# Patient Record
Sex: Female | Born: 1955 | Race: White | Hispanic: No | Marital: Single
Health system: Southern US, Community
[De-identification: ages and names within clinical notes are randomized; demographics above are authoritative.]

## PROBLEM LIST (undated history)

## (undated) DIAGNOSIS — I10 Essential (primary) hypertension: Secondary | ICD-10-CM

## (undated) HISTORY — PX: SHOULDER SURGERY: SHX246

## (undated) HISTORY — DX: Essential (primary) hypertension: I10

---

## 2020-03-22 ENCOUNTER — Emergency Department (HOSPITAL_BASED_OUTPATIENT_CLINIC_OR_DEPARTMENT_OTHER)
Admission: EM | Admit: 2020-03-22 | Discharge: 2020-03-22 | Disposition: A | Discharge status: Home / Self Care | Payer: Worker's Compensation | Attending: Emergency Medicine | Admitting: Emergency Medicine

## 2020-03-22 ENCOUNTER — Encounter (HOSPITAL_BASED_OUTPATIENT_CLINIC_OR_DEPARTMENT_OTHER): Payer: Self-pay | Admitting: Emergency Medicine

## 2020-03-22 ENCOUNTER — Other Ambulatory Visit: Payer: Self-pay

## 2020-03-22 ENCOUNTER — Emergency Department (HOSPITAL_BASED_OUTPATIENT_CLINIC_OR_DEPARTMENT_OTHER): Payer: Worker's Compensation

## 2020-03-22 DIAGNOSIS — Y9389 Activity, other specified: Secondary | ICD-10-CM | POA: Insufficient documentation

## 2020-03-22 DIAGNOSIS — Y929 Unspecified place or not applicable: Secondary | ICD-10-CM | POA: Diagnosis not present

## 2020-03-22 DIAGNOSIS — I10 Essential (primary) hypertension: Secondary | ICD-10-CM | POA: Diagnosis not present

## 2020-03-22 DIAGNOSIS — W19XXXA Unspecified fall, initial encounter: Secondary | ICD-10-CM | POA: Diagnosis not present

## 2020-03-22 DIAGNOSIS — M545 Low back pain, unspecified: Secondary | ICD-10-CM

## 2020-03-22 DIAGNOSIS — Y999 Unspecified external cause status: Secondary | ICD-10-CM | POA: Diagnosis not present

## 2020-03-22 MED ORDER — NAPROXEN 375 MG PO TABS: 375.0000 mg | ORAL_TABLET | Freq: Two times a day (BID) | ORAL | 0 refills | Status: AC

## 2020-03-22 MED ORDER — HYDROCODONE-ACETAMINOPHEN 5-325 MG PO TABS
1.0000 | ORAL_TABLET | Freq: Once | ORAL | Status: AC
  Administered 2020-03-22: 1 via ORAL
  Filled 2020-03-22: qty 1

## 2020-03-22 MED ORDER — METHOCARBAMOL 750 MG PO TABS: 750.0000 mg | ORAL_TABLET | Freq: Every evening | ORAL | 0 refills | Status: AC | PRN

## 2020-03-22 NOTE — ED Triage Notes (Signed)
Pt presents to ED with c/o lower back pain after falling at work today . Daughter from [facility] called [phone] Marylene Land

## 2020-03-22 NOTE — Discharge Instructions (Signed)

## 2020-03-22 NOTE — ED Provider Notes (Signed)
[facility] EMERGENCY DEPARTMENT Provider Note   CSN: [id number] Arrival date & time: 03/22/20  1202     History Chief Complaint  Patient presents with  . Back Pain  . Fall    Samantha Boyd is a 64 y.o. female.  HPI   64 year old female with a history of hypertension presenting for evaluation after a fall.  Patient states that she was pulling something at work and it was very heavy.  It suddenly moved forward and she fell backwards and hit her back on a metal object.  She denies any other injuries including no head trauma or LOC.  Denies any numbness/weakness to the legs.  Denies any loss control of bowel or bladder function.  Has been ambulatory since this occurred.  Past Medical History:  Diagnosis Date  . Hypertension     There are no problems to display for this patient.   Past Surgical History:  Procedure Laterality Date  . SHOULDER SURGERY       OB History   No obstetric history on file.     No family history on file.  Social History   Tobacco Use  . Smoking status: Not on file  Substance Use Topics  . Alcohol use: Not on file  . Drug use: Not on file    Home Medications Prior to Admission medications   Medication Sig Start Date End Date Taking? Authorizing Provider  methocarbamol (ROBAXIN) 750 MG tablet Take 1 tablet (750 mg total) by mouth at bedtime as needed for up to 5 days for muscle spasms. 03/22/20 03/27/20  Aviyanna Colbaugh S, PA-C  naproxen (NAPROSYN) 375 MG tablet Take 1 tablet (375 mg total) by mouth 2 (two) times daily for 5 days. 03/22/20 03/27/20  Ambriel Gorelick S, PA-C    Allergies    Patient has no known allergies.  Review of Systems   Review of Systems  Constitutional: Negative for fever.  HENT: Negative for ear pain.   Eyes: Negative for visual disturbance.  Respiratory: Negative for shortness of breath.   Cardiovascular: Negative for chest pain.  Gastrointestinal: Negative for abdominal pain.  Genitourinary: Negative  for flank pain.  Musculoskeletal: Positive for back pain.  Skin: Negative for wound.  Neurological: Negative for headaches.       No head trauma or LOC  All other systems reviewed and are negative.   Physical Exam Updated Vital Signs BP (!) 155/88 (BP Location: Left Arm)   Pulse 79   Temp (!) 97 F (36.1 C) (Oral)   Resp 16   Ht 5\' 1"  (1.549 m)   Wt 65.8 kg   SpO2 99%   BMI 27.40 kg/m   Physical Exam Vitals and nursing note reviewed.  Constitutional:      General: She is not in acute distress.    Appearance: She is well-developed.  HENT:     Head: Normocephalic and atraumatic.  Eyes:     Conjunctiva/sclera: Conjunctivae normal.  Cardiovascular:     Rate and Rhythm: Normal rate and regular rhythm.  Pulmonary:     Effort: Pulmonary effort is normal.     Breath sounds: Normal breath sounds.  Musculoskeletal:        General: Normal range of motion.     Cervical back: Neck supple.     Comments: TTP to the lower thoracic and upper lumbar spine with associated right sided paraspinous TTP. 5/5 strength to the BLE with normal sensation bilat.   Skin:    General: Skin is  warm and dry.  Neurological:     Mental Status: She is alert.     ED Results / Procedures / Treatments   Labs (all labs ordered are listed, but only abnormal results are displayed) Labs Reviewed - No data to display  EKG None  Radiology DG Thoracic Spine 2 View  Result Date: 03/22/2020 CLINICAL DATA:  Pain following fall EXAM: THORACIC SPINE 3 VIEWS COMPARISON:  None. FINDINGS: Frontal, lateral, and spot lumbosacral lateral images were obtained. There is no fracture or spondylolisthesis. There is mild disc space narrowing at multiple levels. There are multiple anterior and right-sided osteophytes. No erosive change or paraspinous lesions. 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 No fracture or spondylolisthesis. Electronically Signed   By: Bretta Bang III M.D.   On:  03/22/2020 13:33   DG Lumbar Spine Complete  Result Date: 03/22/2020 CLINICAL DATA:  Pain following fall EXAM: LUMBAR SPINE - COMPLETE 4+ VIEW COMPARISON:  None. FINDINGS: Frontal, lateral, spot lumbosacral lateral, and bilateral oblique views were obtained. There are 5 non-rib-bearing lumbar type vertebral bodies. There is slight lumbar levoscoliosis. There is no fracture or spondylolisthesis. There is moderately severe disc space narrowing at L5-S1. Other disc spaces appear unremarkable. There is facet osteoarthritic change at L3-4, L4-5, and L5-S1 bilaterally, most severe at L4-5 and L5-S1 bilaterally. IMPRESSION: Mild scoliosis. No fracture or spondylolisthesis. Moderately severe disc space narrowing at L5-S1. Facet osteoarthritic change at L3-4, L4-5, and L5-S1 noted. Electronically Signed   By: Bretta Bang III M.D.   On: 03/22/2020 13:34   DG Pelvis 1-2 Views  Result Date: 03/22/2020 CLINICAL DATA:  Pain following fall EXAM: PELVIS - 1-2 VIEW COMPARISON:  None. FINDINGS: There is no evidence of pelvic fracture or dislocation. There is moderate symmetric narrowing of each hip joint. There is degenerative change in the pubic symphysis. There is no erosive change. There is osteoporosis. 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 There is also osteoarthritic change in the pubic symphysis. No fracture or dislocation. Bones are osteoporotic. Electronically Signed   By: Bretta Bang III M.D.   On: 03/22/2020 13:34    Procedures Procedures (including critical care time)  Medications Ordered in ED Medications  HYDROcodone-acetaminophen (NORCO/VICODIN) 5-325 MG per tablet 1 tablet (1 tablet Oral Given 03/22/20 1334)    ED Course  I have reviewed the triage vital signs and the nursing notes.  Pertinent labs & imaging results that were available during my care of the patient were reviewed by me and considered in my medical decision making (see chart for details).     MDM Rules/Calculators/A&P                          65 year old female with a history of hypertension presenting for evaluation after a fall.  Patient states that she was pulling something at work and it was very heavy.  It suddenly moved forward and she fell backwards and hit her back on a metal object.  She denies any other injuries including no head trauma or LOC.  Denies any numbness/weakness to the legs.  Denies any loss control of bowel or bladder function.  Has been ambulatory since this occurred.  All imaging reviewed/interpreted - Xray thoracic spine -  osteoarthritic change at multiple levels. No fracture or spondylolisthesis. Xray lumbar spine - scoliosis. No fracture or spondylolisthesis. Moderately severe disc space narrowing at L5-S1. Facet osteoarthritic change at L3-4, L4-5, and L5-S1 noted. 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 There is also osteoarthritic change in the pubic symphysis. No fracture or dislocation. Bones are osteoporotic.  Pt given a dose of norco in the ED and on reassessment she states she is feeling better. She is ambulatory in the ED without significant pain. Low suspicion for occult fx. Will give rx for antiinflammatories for home. Advised on close pcp f/u and return precautions.  Final Clinical Impression(s) / ED Diagnoses Final diagnoses:  Fall, initial encounter  Acute low back pain, unspecified back pain laterality, unspecified whether sciatica present    Rx / DC Orders ED Discharge Orders         Ordered    naproxen (NAPROSYN) 375 MG tablet  2 times daily     Discontinue  Reprint     03/22/20 1504    methocarbamol (ROBAXIN) 750 MG tablet  At bedtime PRN     Discontinue  Reprint     03/22/20 1504           Gauge Winski S, PA-C 03/22/20 1506    Melene Plan, DO 03/23/20 1505

## 2020-03-22 NOTE — ED Notes (Signed)
Pt ambulated to restroom without difficulty

## 2022-01-19 IMAGING — DX DG THORACIC SPINE 2V
3 series · 3 of 3 positions shown · non-contrast
Comparison: None.

CLINICAL DATA: Pain following fall

EXAM:
THORACIC SPINE 3 VIEWS

[t-spine ap]
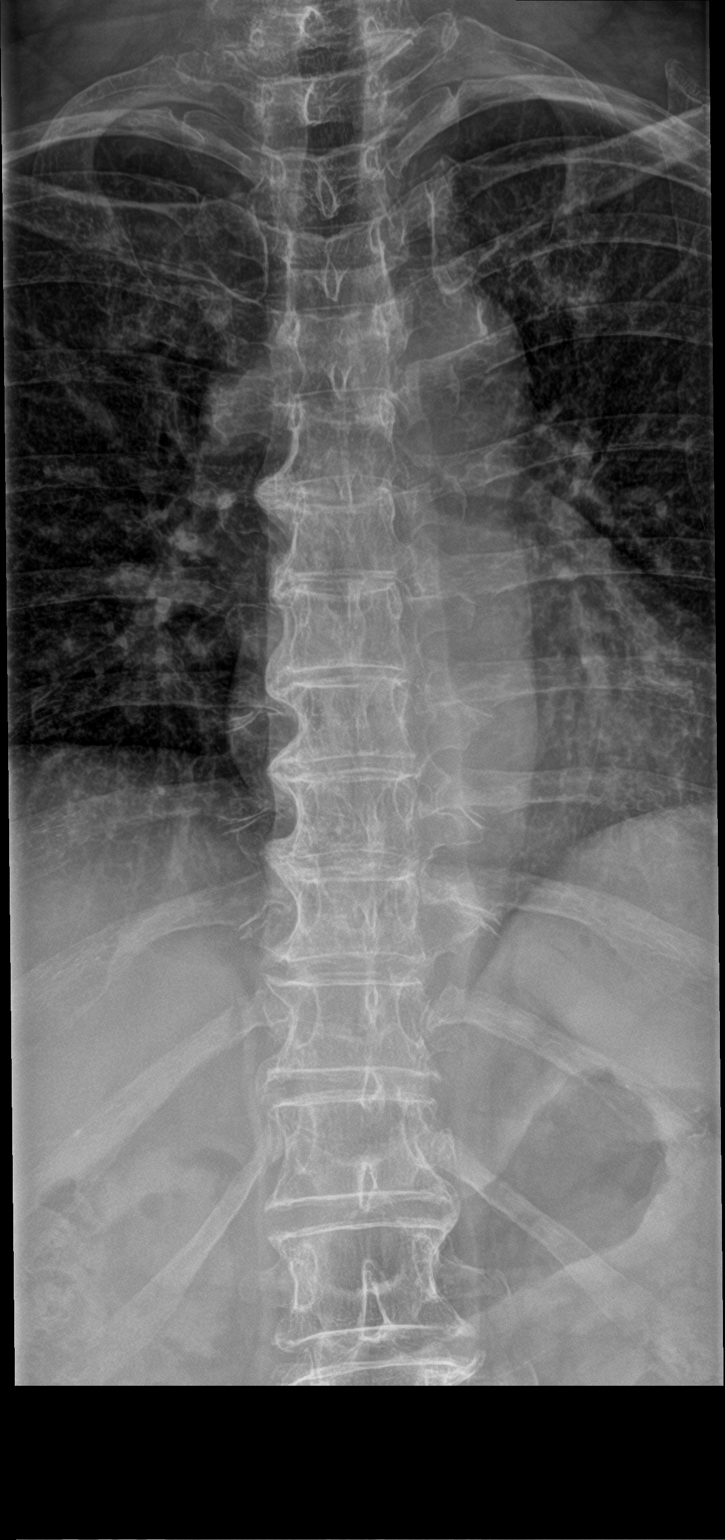

[t-spine lat]
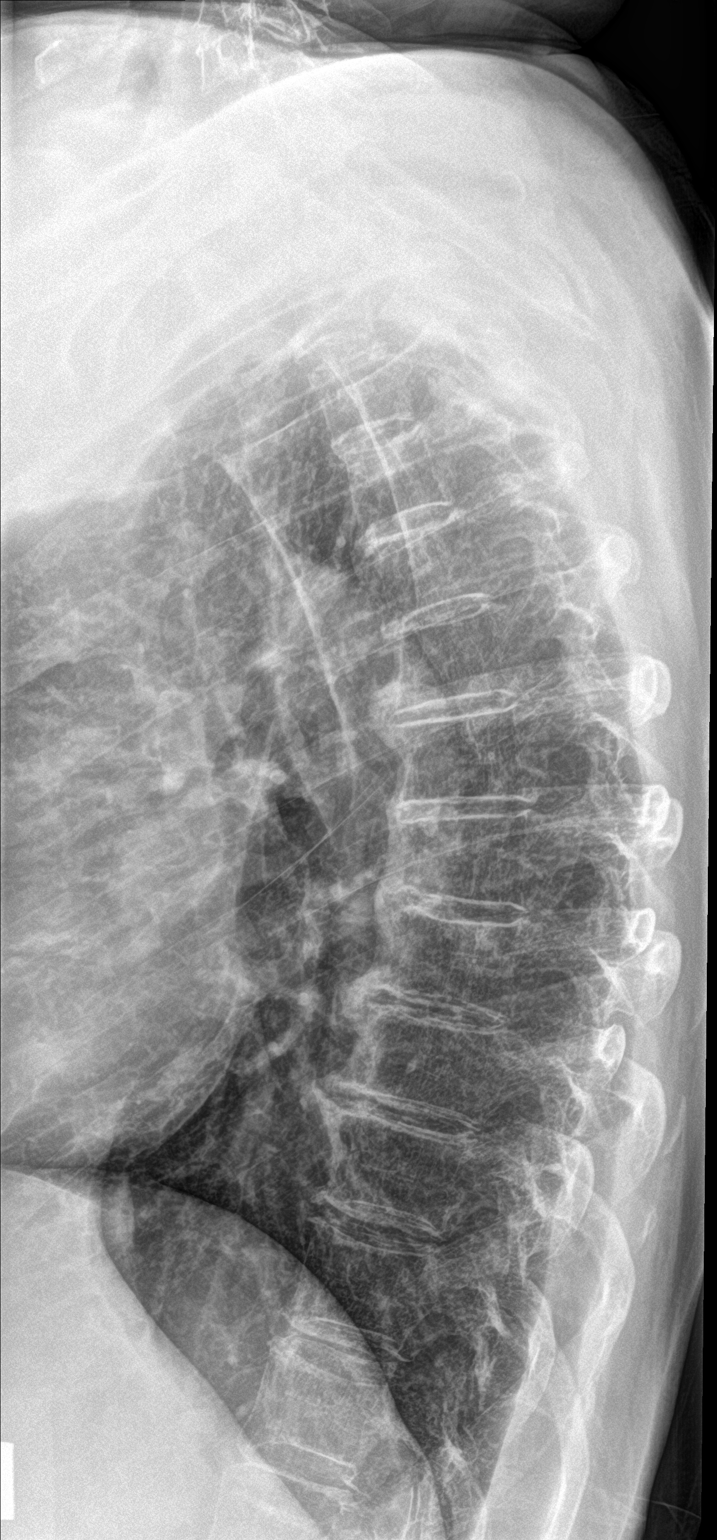

[t-spine swimmers]
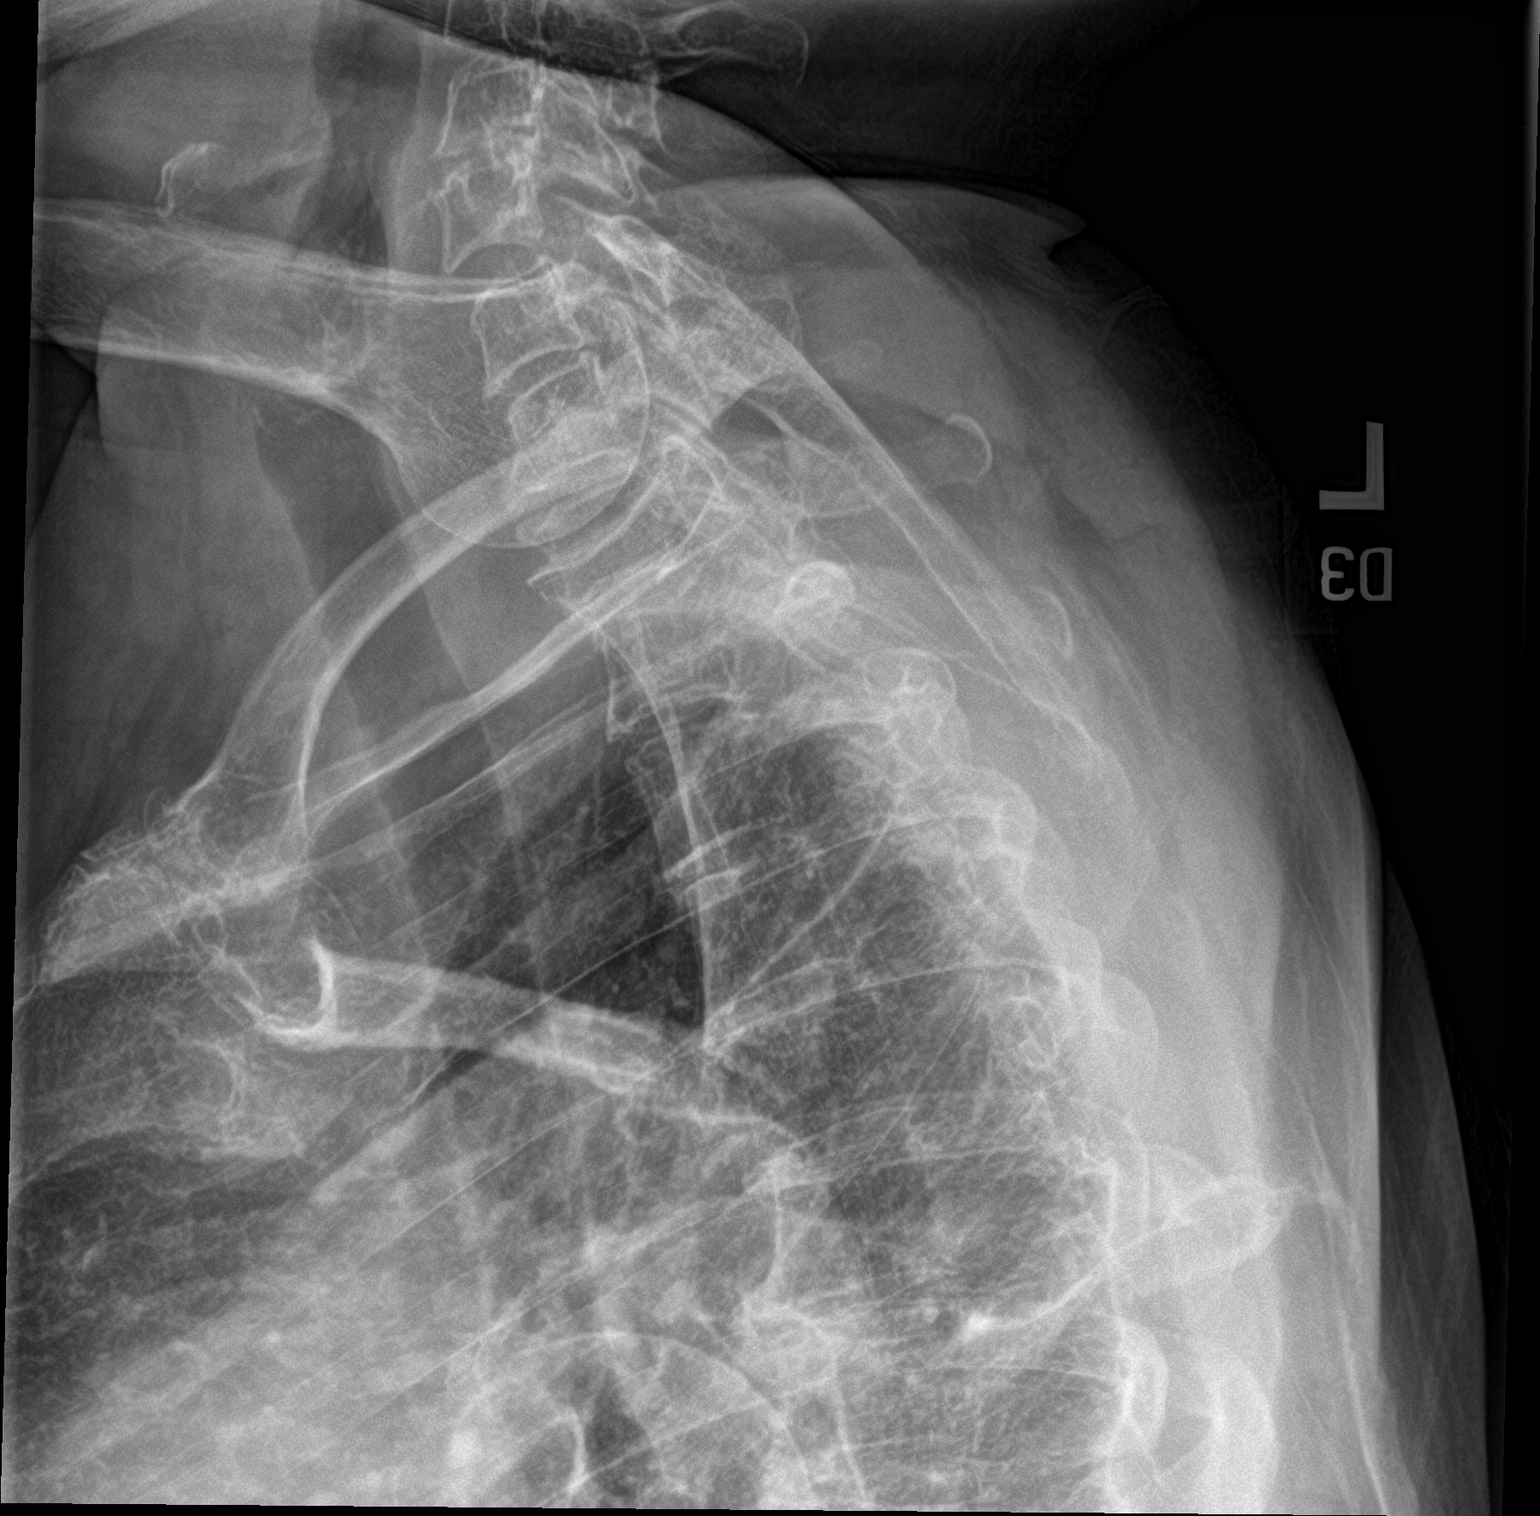

[3 of 3 positions shown; findings below may reference images not displayed]

FINDINGS: Frontal, lateral, and spot lumbosacral lateral images were obtained.
There is no fracture or spondylolisthesis. There is mild disc space
narrowing at multiple levels. There are multiple anterior and
right-sided osteophytes. No erosive change or paraspinous lesions.
IMPRESSION: Relatively mild osteoarthritic change at multiple levels. No
fracture or spondylolisthesis.
